# Patient Record
Sex: Male | Born: 1995 | Race: Black or African American | Hispanic: No | Marital: Single | Smoking: Current every day smoker
Health system: Southern US, Community
[De-identification: ages and names within clinical notes are randomized; demographics above are authoritative.]

---

## 2020-06-19 ENCOUNTER — Inpatient Hospital Stay (HOSPITAL_COMMUNITY): Payer: Self-pay

## 2020-06-19 ENCOUNTER — Encounter (HOSPITAL_COMMUNITY): Payer: Self-pay

## 2020-06-19 ENCOUNTER — Inpatient Hospital Stay (HOSPITAL_COMMUNITY): Payer: Self-pay | Admitting: Certified Registered"

## 2020-06-19 ENCOUNTER — Emergency Department (HOSPITAL_COMMUNITY): Payer: Self-pay

## 2020-06-19 ENCOUNTER — Other Ambulatory Visit: Payer: Self-pay

## 2020-06-19 ENCOUNTER — Inpatient Hospital Stay (HOSPITAL_COMMUNITY)
Admission: EM | Admit: 2020-06-19 | Discharge: 2020-06-20 | DRG: 482 | Disposition: A | Discharge status: Home / Self Care | Payer: Self-pay | Attending: Orthopedic Surgery | Admitting: Orthopedic Surgery

## 2020-06-19 ENCOUNTER — Encounter (HOSPITAL_COMMUNITY)
Admission: EM | Disposition: A | Discharge status: Home / Self Care | Payer: Self-pay | Source: Home / Self Care | Attending: Orthopedic Surgery

## 2020-06-19 DIAGNOSIS — Z20822 Contact with and (suspected) exposure to covid-19: Secondary | ICD-10-CM | POA: Diagnosis present

## 2020-06-19 DIAGNOSIS — S72302A Unspecified fracture of shaft of left femur, initial encounter for closed fracture: Secondary | ICD-10-CM

## 2020-06-19 DIAGNOSIS — Z419 Encounter for procedure for purposes other than remedying health state, unspecified: Secondary | ICD-10-CM

## 2020-06-19 DIAGNOSIS — G8911 Acute pain due to trauma: Secondary | ICD-10-CM | POA: Diagnosis present

## 2020-06-19 DIAGNOSIS — S50311A Abrasion of right elbow, initial encounter: Secondary | ICD-10-CM | POA: Diagnosis present

## 2020-06-19 DIAGNOSIS — S72352B Displaced comminuted fracture of shaft of left femur, initial encounter for open fracture type I or II: Principal | ICD-10-CM | POA: Diagnosis present

## 2020-06-19 DIAGNOSIS — W3400XA Accidental discharge from unspecified firearms or gun, initial encounter: Secondary | ICD-10-CM

## 2020-06-19 DIAGNOSIS — Z23 Encounter for immunization: Secondary | ICD-10-CM

## 2020-06-19 DIAGNOSIS — S7292XA Unspecified fracture of left femur, initial encounter for closed fracture: Secondary | ICD-10-CM | POA: Diagnosis present

## 2020-06-19 HISTORY — PX: FEMUR IM NAIL: SHX1597

## 2020-06-19 LAB — COMPREHENSIVE METABOLIC PANEL
ALT: 23 U/L (ref 0–44)
AST: 30 U/L (ref 15–41)
Albumin: 3.9 g/dL (ref 3.5–5.0)
Alkaline Phosphatase: 43 U/L (ref 38–126)
Anion gap: 11 (ref 5–15)
BUN: 10 mg/dL (ref 6–20)
CO2: 24 mmol/L (ref 22–32)
Calcium: 9.1 mg/dL (ref 8.9–10.3)
Chloride: 110 mmol/L (ref 98–111)
Creatinine, Ser: 1.16 mg/dL (ref 0.61–1.24)
GFR, Estimated: >60 mL/min (ref >60)
Glucose, Bld: 111 mg/dL — ABNORMAL HIGH (ref 70–99)
Potassium: 3.4 mmol/L — ABNORMAL LOW (ref 3.5–5.1)
Sodium: 145 mmol/L (ref 135–145)
Total Bilirubin: 0.7 mg/dL (ref 0.3–1.2)
Total Protein: 6.7 g/dL (ref 6.5–8.1)

## 2020-06-19 LAB — ETHANOL: Alcohol, Ethyl (B): 221 mg/dL — ABNORMAL HIGH (ref <10)

## 2020-06-19 LAB — RESP PANEL BY RT-PCR (FLU A&B, COVID) ARPGX2
Influenza A by PCR: NEGATIVE
Influenza B by PCR: NEGATIVE
SARS Coronavirus 2 by RT PCR: NEGATIVE

## 2020-06-19 LAB — CBC
HCT: 40.3 % (ref 39.0–52.0)
Hemoglobin: 13.4 g/dL (ref 13.0–17.0)
MCH: 32.1 pg (ref 26.0–34.0)
MCHC: 33.3 g/dL (ref 30.0–36.0)
MCV: 96.4 fL (ref 80.0–100.0)
Platelets: 283 10*3/uL (ref 150–400)
RBC: 4.18 MIL/uL — ABNORMAL LOW (ref 4.22–5.81)
RDW: 13.4 % (ref 11.5–15.5)
WBC: 8.8 10*3/uL (ref 4.0–10.5)
nRBC: 0 % (ref 0.0–0.2)

## 2020-06-19 LAB — SAMPLE TO BLOOD BANK

## 2020-06-19 LAB — SURGICAL PCR SCREEN
MRSA, PCR: NEGATIVE
Staphylococcus aureus: NEGATIVE

## 2020-06-19 LAB — I-STAT CHEM 8, ED
BUN: 11 mg/dL (ref 6–20)
Calcium, Ion: 1.08 mmol/L — ABNORMAL LOW (ref 1.15–1.40)
Chloride: 110 mmol/L (ref 98–111)
Creatinine, Ser: 1.3 mg/dL — ABNORMAL HIGH (ref 0.61–1.24)
Glucose, Bld: 109 mg/dL — ABNORMAL HIGH (ref 70–99)
HCT: 40 % (ref 39.0–52.0)
Hemoglobin: 13.6 g/dL (ref 13.0–17.0)
Potassium: 3.3 mmol/L — ABNORMAL LOW (ref 3.5–5.1)
Sodium: 146 mmol/L — ABNORMAL HIGH (ref 135–145)
TCO2: 23 mmol/L (ref 22–32)

## 2020-06-19 LAB — TYPE AND SCREEN
ABO/RH(D): A POS
Antibody Screen: NEGATIVE

## 2020-06-19 LAB — ABO/RH: ABO/RH(D): A POS

## 2020-06-19 LAB — HIV ANTIBODY (ROUTINE TESTING W REFLEX): HIV Screen 4th Generation wRfx: NONREACTIVE

## 2020-06-19 SURGERY — INSERTION, INTRAMEDULLARY ROD, FEMUR
Anesthesia: General | Laterality: Left

## 2020-06-19 MED ORDER — ONDANSETRON HCL 4 MG/2ML IJ SOLN: 4.0000 mg | Freq: Four times a day (QID) | INTRAMUSCULAR | Status: DC | PRN

## 2020-06-19 MED ORDER — FENTANYL CITRATE (PF) 100 MCG/2ML IJ SOLN: 25.0000 ug | INTRAMUSCULAR | Status: DC | PRN

## 2020-06-19 MED ORDER — ROCURONIUM BROMIDE 10 MG/ML (PF) SYRINGE
PREFILLED_SYRINGE | INTRAVENOUS | Status: DC | PRN
  Administered 2020-06-19: 20 mg via INTRAVENOUS
  Administered 2020-06-19: 30 mg via INTRAVENOUS

## 2020-06-19 MED ORDER — ENOXAPARIN SODIUM 30 MG/0.3ML IJ SOSY: 30.0000 mg | PREFILLED_SYRINGE | Freq: Two times a day (BID) | INTRAMUSCULAR | Status: DC

## 2020-06-19 MED ORDER — ONDANSETRON HCL 4 MG/2ML IJ SOLN: 4.0000 mg | Freq: Once | INTRAMUSCULAR | Status: DC | PRN

## 2020-06-19 MED ORDER — CEFAZOLIN SODIUM-DEXTROSE 2-4 GM/100ML-% IV SOLN: 2.0000 g | INTRAVENOUS | Status: DC

## 2020-06-19 MED ORDER — ACETAMINOPHEN 500 MG PO TABS
1000.0000 mg | ORAL_TABLET | Freq: Once | ORAL | Status: AC
  Administered 2020-06-19: 1000 mg via ORAL

## 2020-06-19 MED ORDER — OXYCODONE HCL 5 MG PO TABS: 10.0000 mg | ORAL_TABLET | ORAL | Status: DC | PRN

## 2020-06-19 MED ORDER — OXYCODONE HCL 5 MG PO TABS: 5.0000 mg | ORAL_TABLET | ORAL | Status: DC | PRN

## 2020-06-19 MED ORDER — DEXAMETHASONE SODIUM PHOSPHATE 10 MG/ML IJ SOLN
8.0000 mg | Freq: Once | INTRAMUSCULAR | Status: AC
  Administered 2020-06-19: 4 mg via INTRAVENOUS

## 2020-06-19 MED ORDER — MORPHINE SULFATE (PF) 4 MG/ML IV SOLN
INTRAVENOUS | Status: AC
  Filled 2020-06-19: qty 1

## 2020-06-19 MED ORDER — METOCLOPRAMIDE HCL 5 MG/ML IJ SOLN: 5.0000 mg | Freq: Three times a day (TID) | INTRAMUSCULAR | Status: DC | PRN

## 2020-06-19 MED ORDER — ACETAMINOPHEN 325 MG PO TABS: 325.0000 mg | ORAL_TABLET | Freq: Four times a day (QID) | ORAL | Status: DC | PRN

## 2020-06-19 MED ORDER — LIDOCAINE HCL (CARDIAC) PF 100 MG/5ML IV SOSY
PREFILLED_SYRINGE | INTRAVENOUS | Status: DC | PRN
  Administered 2020-06-19: 40 mg via INTRAVENOUS

## 2020-06-19 MED ORDER — MENTHOL 3 MG MT LOZG: 1.0000 | LOZENGE | OROMUCOSAL | Status: DC | PRN

## 2020-06-19 MED ORDER — SODIUM CHLORIDE 0.9 % IR SOLN
Status: DC | PRN
  Administered 2020-06-19: 1000 mL

## 2020-06-19 MED ORDER — ALUM & MAG HYDROXIDE-SIMETH 200-200-20 MG/5ML PO SUSP: 30.0000 mL | ORAL | Status: DC | PRN

## 2020-06-19 MED ORDER — TETANUS-DIPHTH-ACELL PERTUSSIS 5-2.5-18.5 LF-MCG/0.5 IM SUSY
0.5000 mL | PREFILLED_SYRINGE | Freq: Once | INTRAMUSCULAR | Status: AC
  Administered 2020-06-19: 0.5 mL via INTRAMUSCULAR
  Filled 2020-06-19: qty 0.5

## 2020-06-19 MED ORDER — MORPHINE SULFATE 2 MG/ML IJ SOLN
INTRAMUSCULAR | Status: AC | PRN
  Administered 2020-06-19: 4 mg via INTRAVENOUS

## 2020-06-19 MED ORDER — PHENYLEPHRINE 40 MCG/ML (10ML) SYRINGE FOR IV PUSH (FOR BLOOD PRESSURE SUPPORT)
PREFILLED_SYRINGE | INTRAVENOUS | Status: AC
  Filled 2020-06-19: qty 10

## 2020-06-19 MED ORDER — TRAMADOL HCL 50 MG PO TABS
50.0000 mg | ORAL_TABLET | Freq: Four times a day (QID) | ORAL | Status: DC
Start: 2020-06-19 — End: 2020-06-20
  Administered 2020-06-19 – 2020-06-20 (×4): 50 mg via ORAL
  Filled 2020-06-19 (×4): qty 1

## 2020-06-19 MED ORDER — ENOXAPARIN SODIUM 40 MG/0.4ML IJ SOSY
40.0000 mg | PREFILLED_SYRINGE | INTRAMUSCULAR | Status: DC
  Administered 2020-06-20: 40 mg via SUBCUTANEOUS
  Filled 2020-06-19: qty 0.4

## 2020-06-19 MED ORDER — CHLORHEXIDINE GLUCONATE 0.12 % MT SOLN
OROMUCOSAL | Status: AC
  Administered 2020-06-19: 15 mL via OROMUCOSAL
  Filled 2020-06-19: qty 15

## 2020-06-19 MED ORDER — TRANEXAMIC ACID-NACL 1000-0.7 MG/100ML-% IV SOLN
1000.0000 mg | Freq: Once | INTRAVENOUS | Status: AC
  Administered 2020-06-19: 1000 mg via INTRAVENOUS
  Filled 2020-06-19: qty 100

## 2020-06-19 MED ORDER — SUFENTANIL CITRATE 50 MCG/ML IV SOLN
INTRAVENOUS | Status: DC | PRN
  Administered 2020-06-19: 10 ug via INTRAVENOUS
  Administered 2020-06-19: 5 ug via INTRAVENOUS

## 2020-06-19 MED ORDER — TRANEXAMIC ACID-NACL 1000-0.7 MG/100ML-% IV SOLN
1000.0000 mg | INTRAVENOUS | Status: AC
  Administered 2020-06-19: 1000 mg via INTRAVENOUS

## 2020-06-19 MED ORDER — SODIUM CHLORIDE 0.9 % IV SOLN: INTRAVENOUS | Status: DC

## 2020-06-19 MED ORDER — POLYETHYLENE GLYCOL 3350 17 G PO PACK: 17.0000 g | PACK | Freq: Every day | ORAL | Status: DC | PRN

## 2020-06-19 MED ORDER — FENTANYL CITRATE (PF) 100 MCG/2ML IJ SOLN
100.0000 ug | Freq: Once | INTRAMUSCULAR | Status: AC
  Administered 2020-06-19: 100 ug via INTRAVENOUS
  Filled 2020-06-19: qty 2

## 2020-06-19 MED ORDER — METHOCARBAMOL 500 MG PO TABS: 500.0000 mg | ORAL_TABLET | Freq: Four times a day (QID) | ORAL | Status: DC | PRN

## 2020-06-19 MED ORDER — DOCUSATE SODIUM 100 MG PO CAPS
100.0000 mg | ORAL_CAPSULE | Freq: Two times a day (BID) | ORAL | Status: DC
  Administered 2020-06-19 – 2020-06-20 (×2): 100 mg via ORAL
  Filled 2020-06-19 (×2): qty 1

## 2020-06-19 MED ORDER — CHLORHEXIDINE GLUCONATE 0.12 % MT SOLN: 15.0000 mL | Freq: Once | OROMUCOSAL | Status: AC

## 2020-06-19 MED ORDER — PHENOL 1.4 % MT LIQD: 1.0000 | OROMUCOSAL | Status: DC | PRN

## 2020-06-19 MED ORDER — PROPOFOL 10 MG/ML IV BOLUS
INTRAVENOUS | Status: DC | PRN
  Administered 2020-06-19: 170 mg via INTRAVENOUS

## 2020-06-19 MED ORDER — CEFAZOLIN SODIUM-DEXTROSE 2-4 GM/100ML-% IV SOLN
2.0000 g | Freq: Three times a day (TID) | INTRAVENOUS | Status: AC
  Administered 2020-06-19 – 2020-06-20 (×3): 2 g via INTRAVENOUS
  Filled 2020-06-19 (×3): qty 100

## 2020-06-19 MED ORDER — ONDANSETRON 4 MG PO TBDP: 4.0000 mg | ORAL_TABLET | Freq: Four times a day (QID) | ORAL | Status: DC | PRN

## 2020-06-19 MED ORDER — MIDAZOLAM HCL 2 MG/2ML IJ SOLN
INTRAMUSCULAR | Status: AC
  Filled 2020-06-19: qty 2

## 2020-06-19 MED ORDER — CEFAZOLIN SODIUM-DEXTROSE 2-4 GM/100ML-% IV SOLN
2.0000 g | Freq: Once | INTRAVENOUS | Status: AC
  Administered 2020-06-19: 2 g via INTRAVENOUS
  Filled 2020-06-19: qty 100

## 2020-06-19 MED ORDER — PROPOFOL 10 MG/ML IV BOLUS
INTRAVENOUS | Status: AC
  Filled 2020-06-19: qty 20

## 2020-06-19 MED ORDER — BISACODYL 10 MG RE SUPP: 10.0000 mg | Freq: Every day | RECTAL | Status: DC | PRN

## 2020-06-19 MED ORDER — POVIDONE-IODINE 10 % EX SWAB: 2.0000 "application " | Freq: Once | CUTANEOUS | Status: DC

## 2020-06-19 MED ORDER — MIDAZOLAM HCL 2 MG/2ML IJ SOLN
INTRAMUSCULAR | Status: DC | PRN
  Administered 2020-06-19: 2 mg via INTRAVENOUS

## 2020-06-19 MED ORDER — TRANEXAMIC ACID-NACL 1000-0.7 MG/100ML-% IV SOLN
INTRAVENOUS | Status: AC
  Filled 2020-06-19: qty 100

## 2020-06-19 MED ORDER — DOCUSATE SODIUM 100 MG PO CAPS: 100.0000 mg | ORAL_CAPSULE | Freq: Two times a day (BID) | ORAL | Status: DC

## 2020-06-19 MED ORDER — HYDROMORPHONE HCL 1 MG/ML IJ SOLN
0.5000 mg | INTRAMUSCULAR | Status: DC | PRN
  Administered 2020-06-19: 0.5 mg via INTRAVENOUS
  Administered 2020-06-19 – 2020-06-20 (×2): 1 mg via INTRAVENOUS
  Filled 2020-06-19 (×3): qty 1

## 2020-06-19 MED ORDER — LACTATED RINGERS IV SOLN: INTRAVENOUS | Status: DC

## 2020-06-19 MED ORDER — LACTATED RINGERS IV SOLN: INTRAVENOUS | Status: DC | PRN

## 2020-06-19 MED ORDER — SUGAMMADEX SODIUM 200 MG/2ML IV SOLN
INTRAVENOUS | Status: DC | PRN
  Administered 2020-06-19: 200 mg via INTRAVENOUS

## 2020-06-19 MED ORDER — PANTOPRAZOLE SODIUM 40 MG PO TBEC
40.0000 mg | DELAYED_RELEASE_TABLET | Freq: Every day | ORAL | Status: DC
  Administered 2020-06-19 – 2020-06-20 (×2): 40 mg via ORAL
  Filled 2020-06-19 (×2): qty 1

## 2020-06-19 MED ORDER — ACETAMINOPHEN 500 MG PO TABS: 1000.0000 mg | ORAL_TABLET | Freq: Four times a day (QID) | ORAL | Status: DC

## 2020-06-19 MED ORDER — IOHEXOL 350 MG/ML SOLN
100.0000 mL | Freq: Once | INTRAVENOUS | Status: AC | PRN
  Administered 2020-06-19: 100 mL via INTRAVENOUS

## 2020-06-19 MED ORDER — CHLORHEXIDINE GLUCONATE 4 % EX LIQD: 60.0000 mL | Freq: Once | CUTANEOUS | Status: DC

## 2020-06-19 MED ORDER — ACETAMINOPHEN 500 MG PO TABS
1000.0000 mg | ORAL_TABLET | Freq: Three times a day (TID) | ORAL | Status: DC
  Administered 2020-06-19: 1000 mg via ORAL
  Filled 2020-06-19 (×2): qty 2

## 2020-06-19 MED ORDER — PHENYLEPHRINE 40 MCG/ML (10ML) SYRINGE FOR IV PUSH (FOR BLOOD PRESSURE SUPPORT)
PREFILLED_SYRINGE | INTRAVENOUS | Status: DC | PRN
  Administered 2020-06-19: 40 ug via INTRAVENOUS
  Administered 2020-06-19 (×2): 80 ug via INTRAVENOUS
  Administered 2020-06-19: 40 ug via INTRAVENOUS
  Administered 2020-06-19: 80 ug via INTRAVENOUS
  Administered 2020-06-19 (×2): 40 ug via INTRAVENOUS

## 2020-06-19 MED ORDER — METHOCARBAMOL 1000 MG/10ML IJ SOLN
500.0000 mg | Freq: Four times a day (QID) | INTRAVENOUS | Status: DC | PRN
  Filled 2020-06-19: qty 5

## 2020-06-19 MED ORDER — ONDANSETRON HCL 4 MG/2ML IJ SOLN
INTRAMUSCULAR | Status: DC | PRN
  Administered 2020-06-19: 4 mg via INTRAVENOUS

## 2020-06-19 MED ORDER — ALBUMIN HUMAN 5 % IV SOLN: INTRAVENOUS | Status: DC | PRN

## 2020-06-19 MED ORDER — CEFAZOLIN SODIUM-DEXTROSE 2-4 GM/100ML-% IV SOLN
2.0000 g | Freq: Three times a day (TID) | INTRAVENOUS | Status: DC
  Administered 2020-06-19: 2 g via INTRAVENOUS
  Filled 2020-06-19 (×3): qty 100

## 2020-06-19 MED ORDER — METOCLOPRAMIDE HCL 5 MG PO TABS: 5.0000 mg | ORAL_TABLET | Freq: Three times a day (TID) | ORAL | Status: DC | PRN

## 2020-06-19 MED ORDER — SUFENTANIL CITRATE 50 MCG/ML IV SOLN
INTRAVENOUS | Status: AC
  Filled 2020-06-19: qty 1

## 2020-06-19 MED ORDER — DEXAMETHASONE SODIUM PHOSPHATE 10 MG/ML IJ SOLN
INTRAMUSCULAR | Status: AC
  Filled 2020-06-19: qty 1

## 2020-06-19 MED ORDER — ONDANSETRON HCL 4 MG PO TABS: 4.0000 mg | ORAL_TABLET | Freq: Four times a day (QID) | ORAL | Status: DC | PRN

## 2020-06-19 MED ORDER — SUCCINYLCHOLINE 20MG/ML (10ML) SYRINGE FOR MEDFUSION PUMP - OPTIME
INTRAMUSCULAR | Status: DC | PRN
  Administered 2020-06-19: 140 mg via INTRAVENOUS

## 2020-06-19 MED ORDER — ORAL CARE MOUTH RINSE: 15.0000 mL | Freq: Once | OROMUCOSAL | Status: AC

## 2020-06-19 MED ORDER — MAGNESIUM CITRATE PO SOLN: 1.0000 | Freq: Once | ORAL | Status: DC | PRN

## 2020-06-19 MED ORDER — SODIUM CHLORIDE 0.9 % IV SOLN
INTRAVENOUS | Status: AC | PRN
  Administered 2020-06-19: 1000 mL via INTRAVENOUS

## 2020-06-19 MED ORDER — ONDANSETRON HCL 4 MG/2ML IJ SOLN
INTRAMUSCULAR | Status: AC
  Filled 2020-06-19: qty 2

## 2020-06-19 MED ORDER — HYDROMORPHONE HCL 1 MG/ML IJ SOLN
0.5000 mg | INTRAMUSCULAR | Status: DC | PRN
  Administered 2020-06-19 (×2): 0.5 mg via INTRAVENOUS
  Filled 2020-06-19: qty 1
  Filled 2020-06-19: qty 0.5

## 2020-06-19 MED ORDER — ROCURONIUM BROMIDE 10 MG/ML (PF) SYRINGE
PREFILLED_SYRINGE | INTRAVENOUS | Status: AC
  Filled 2020-06-19: qty 10

## 2020-06-19 SURGICAL SUPPLY — 55 items
BIT DRILL AO GAMMA 4.2X180 (BIT) ×1 IMPLANT
BIT DRILL AO GAMMA 4.2X340 (BIT) ×1 IMPLANT
BNDG CMPR MED 15X6 ELC VLCR LF (GAUZE/BANDAGES/DRESSINGS) ×1
BNDG ELASTIC 6X15 VLCR STRL LF (GAUZE/BANDAGES/DRESSINGS) ×1 IMPLANT
BNDG ELASTIC 6X5.8 VLCR STR LF (GAUZE/BANDAGES/DRESSINGS) ×1 IMPLANT
CLSR STERI-STRIP ANTIMIC 1/2X4 (GAUZE/BANDAGES/DRESSINGS) ×1 IMPLANT
COVER MAYO STAND STRL (DRAPES) ×2 IMPLANT
COVER PERINEAL POST (MISCELLANEOUS) ×1 IMPLANT
COVER SURGICAL LIGHT HANDLE (MISCELLANEOUS) ×2 IMPLANT
COVER WAND RF STERILE (DRAPES) ×2 IMPLANT
DRAPE STERI IOBAN 125X83 (DRAPES) ×2 IMPLANT
DRSG ADAPTIC 3X8 NADH LF (GAUZE/BANDAGES/DRESSINGS) ×1 IMPLANT
DRSG MEPILEX BORDER 4X4 (GAUZE/BANDAGES/DRESSINGS) ×6 IMPLANT
DURAPREP 26ML APPLICATOR (WOUND CARE) ×3 IMPLANT
ELECT REM PT RETURN 9FT ADLT (ELECTROSURGICAL) ×2
ELECTRODE REM PT RTRN 9FT ADLT (ELECTROSURGICAL) ×1 IMPLANT
GAUZE SPONGE 4X4 12PLY STRL (GAUZE/BANDAGES/DRESSINGS) ×2 IMPLANT
GLOVE BIO SURGEON STRL SZ7.5 (GLOVE) ×2 IMPLANT
GLOVE BIOGEL PI IND STRL 7.5 (GLOVE) ×1 IMPLANT
GLOVE BIOGEL PI INDICATOR 7.5 (GLOVE) ×1
GLOVE SRG 8 PF TXTR STRL LF DI (GLOVE) ×1 IMPLANT
GLOVE SURG SYN 7.5  E (GLOVE) ×1
GLOVE SURG SYN 7.5 E (GLOVE) ×1 IMPLANT
GLOVE SURG SYN 7.5 PF PI (GLOVE) ×1 IMPLANT
GLOVE SURG UNDER POLY LF SZ8 (GLOVE) ×2
GOWN STRL REUS W/ TWL LRG LVL3 (GOWN DISPOSABLE) ×2 IMPLANT
GOWN STRL REUS W/ TWL XL LVL3 (GOWN DISPOSABLE) ×2 IMPLANT
GOWN STRL REUS W/TWL LRG LVL3 (GOWN DISPOSABLE) ×4
GOWN STRL REUS W/TWL XL LVL3 (GOWN DISPOSABLE) ×4
GUIDEROD T2 3X1000 (ROD) ×1 IMPLANT
GUIDEWIRE GAMMA (WIRE) ×1 IMPLANT
KIT BASIN OR (CUSTOM PROCEDURE TRAY) ×1 IMPLANT
KIT TURNOVER KIT B (KITS) ×2 IMPLANT
MANIFOLD NEPTUNE II (INSTRUMENTS) ×1 IMPLANT
NAIL SUPRACONDYLAR 11X380MM (Nail) ×1 IMPLANT
NS IRRIG 1000ML POUR BTL (IV SOLUTION) ×1 IMPLANT
PACK GENERAL/GYN (CUSTOM PROCEDURE TRAY) ×2 IMPLANT
PAD ABD 8X10 STRL (GAUZE/BANDAGES/DRESSINGS) ×2 IMPLANT
PAD ARMBOARD 7.5X6 YLW CONV (MISCELLANEOUS) ×4 IMPLANT
PADDING CAST COTTON 6X4 STRL (CAST SUPPLIES) ×2 IMPLANT
REAMER SHAFT BIXCUT (INSTRUMENTS) ×1 IMPLANT
SCREW LOCK 5X90 FT T2 (Screw) ×1 IMPLANT
SCREW LOCKING T2 F/T  5MMX70MM (Screw) ×1 IMPLANT
SCREW LOCKING T2 F/T 5MMX70MM (Screw) IMPLANT
SCREW LOCKING THREADED 5X47.5 (Screw) ×1 IMPLANT
STRIP CLOSURE SKIN 1/2X4 (GAUZE/BANDAGES/DRESSINGS) ×1 IMPLANT
SUT ETHILON 3 0 PS 1 (SUTURE) ×2 IMPLANT
SUT MNCRL AB 4-0 PS2 18 (SUTURE) ×2 IMPLANT
SUT VIC AB 0 CT1 27 (SUTURE) ×2
SUT VIC AB 0 CT1 27XBRD ANBCTR (SUTURE) IMPLANT
SUT VIC AB 2-0 CT1 27 (SUTURE) ×2
SUT VIC AB 2-0 CT1 TAPERPNT 27 (SUTURE) ×1 IMPLANT
TOWEL GREEN STERILE (TOWEL DISPOSABLE) ×2 IMPLANT
TOWEL OR NON WOVEN STRL DISP B (DISPOSABLE) ×2 IMPLANT
WATER STERILE IRR 1000ML POUR (IV SOLUTION) ×2 IMPLANT

## 2020-06-19 NOTE — Interval H&P Note (Signed)
History and Physical Interval Note:  06/19/2020 12:03 PM  Allen Wall  has presented today for surgery, with the diagnosis of Left femur fracture.  The various methods of treatment have been discussed with the patient and family. After consideration of risks, benefits and other options for treatment, the patient has consented to  Procedure(s): INTRAMEDULLARY (IM) NAIL FEMORAL (Left) as a surgical intervention.  The patient's history has been reviewed, patient examined, no change in status, stable for surgery.  I have reviewed the patient's chart and labs.  Questions were answered to the patient's satisfaction.     Sheral Apley
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
## 2020-06-19 NOTE — Progress Notes (Signed)
Orthopedic Tech Progress Note Patient Details:  Daking Westervelt May 27, 1995 [id number]  Patient ID: Theus Espin, male   DOB: [birth year], 25 y.o.   MRN: [id number]   Kizzie Fantasia 06/19/2020, 2:53 AM trauma

## 2020-06-19 NOTE — Transfer of Care (Signed)
Immediate Anesthesia Transfer of Care Note  Patient: Philippe Gang  Procedure(s) Performed: INTRAMEDULLARY (IM) NAIL FEMORAL (Left )  Patient Location: PACU  Anesthesia Type:General  Level of Consciousness: awake, alert , oriented and patient cooperative  Airway & Oxygen Therapy: Patient Spontanous Breathing  Post-op Assessment: Report given to RN and Post -op Vital signs reviewed and stable  Post vital signs: Reviewed and stable  Last Vitals:  Vitals Value Taken Time  BP 137/95 06/19/20 1614  Temp    Pulse 56 06/19/20 1615  Resp 13 06/19/20 1615  SpO2 100 % 06/19/20 1615  Vitals shown include unvalidated device data.  Last Pain:  Vitals:   06/19/20 1200  TempSrc:   PainSc: Asleep         Complications: No complications documented.
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
## 2020-06-19 NOTE — Anesthesia Procedure Notes (Signed)
Procedure Name: Intubation Date/Time: 06/19/2020 2:32 PM Performed by: Claris Che, CRNA Pre-anesthesia Checklist: Patient identified, Emergency Drugs available, Suction available, Patient being monitored and Timeout performed Patient Re-evaluated:Patient Re-evaluated prior to induction Oxygen Delivery Method: Circle system utilized Preoxygenation: Pre-oxygenation with 100% oxygen Induction Type: IV induction, Rapid sequence and Cricoid Pressure applied Ventilation: Mask ventilation without difficulty Laryngoscope Size: Mac and 4 Grade View: Grade II Tube type: Oral Tube size: 7.5 mm Number of attempts: 1 Airway Equipment and Method: Stylet Placement Confirmation: ETT inserted through vocal cords under direct vision,  positive ETCO2 and breath sounds checked- equal and bilateral Secured at: 23 cm Tube secured with: Tape Dental Injury: Teeth and Oropharynx as per pre-operative assessment
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
## 2020-06-19 NOTE — ED Notes (Signed)
Patient transported to CT 

## 2020-06-19 NOTE — Anesthesia Postprocedure Evaluation (Signed)
Anesthesia Post Note  Patient: Allen Wall  Procedure(s) Performed: INTRAMEDULLARY (IM) NAIL FEMORAL (Left )     Patient location during evaluation: PACU Anesthesia Type: General Level of consciousness: awake and alert Pain management: pain level controlled Vital Signs Assessment: post-procedure vital signs reviewed and stable Respiratory status: spontaneous breathing, nonlabored ventilation, respiratory function stable and patient connected to nasal cannula oxygen Cardiovascular status: blood pressure returned to baseline and stable Postop Assessment: no apparent nausea or vomiting Anesthetic complications: no   No complications documented.  Last Vitals:  Vitals:   06/19/20 1644 06/19/20 1657  BP: 125/79 124/77  Pulse: (!) 49 60  Resp: 14 17  Temp: 36.7 C 36.8 C  SpO2: 100% 98%    Last Pain:  Vitals:   06/19/20 1657  TempSrc: Oral  PainSc: 7                  Alberta Lenhard S

## 2020-06-19 NOTE — Anesthesia Preprocedure Evaluation (Addendum)
Anesthesia Evaluation  Patient identified by MRN, date of birth, ID band Patient awake    Reviewed: Allergy & Precautions, NPO status , Patient's Chart, lab work & pertinent test results  Airway Mallampati: II  TM Distance: >3 FB Neck ROM: Full    Dental  (+) Teeth Intact, Dental Advisory Given   Pulmonary    breath sounds clear to auscultation       Cardiovascular  Rhythm:Regular Rate:Normal     Neuro/Psych    GI/Hepatic   Endo/Other    Renal/GU      Musculoskeletal   Abdominal   Peds  Hematology   Anesthesia Other Findings   Reproductive/Obstetrics                            Anesthesia Physical Anesthesia Plan  ASA: II  Anesthesia Plan: General   Post-op Pain Management:    Induction: Intravenous, Cricoid pressure planned and Rapid sequence  PONV Risk Score and Plan: Ondansetron and Dexamethasone  Airway Management Planned: Oral ETT  Additional Equipment:   Intra-op Plan:   Post-operative Plan: Extubation in OR  Informed Consent: I have reviewed the patients History and Physical, chart, labs and discussed the procedure including the risks, benefits and alternatives for the proposed anesthesia with the patient or authorized representative who has indicated his/her understanding and acceptance.     Dental advisory given  Plan Discussed with: CRNA and Anesthesiologist  Anesthesia Plan Comments:         Anesthesia Quick Evaluation

## 2020-06-19 NOTE — Plan of Care (Signed)
  Problem: Clinical Measurements: Goal: Ability to maintain clinical measurements within normal limits will improve Outcome: Progressing   Problem: Coping: Goal: Level of anxiety will decrease Outcome: Progressing   Problem: Elimination: Goal: Will not experience complications related to bowel motility Outcome: Progressing   Problem: Pain Managment: Goal: General experience of comfort will improve Outcome: Progressing   Problem: Safety: Goal: Ability to remain free from injury will improve Outcome: Progressing   Problem: Skin Integrity: Goal: Risk for impaired skin integrity will decrease Outcome: Progressing   
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
## 2020-06-20 ENCOUNTER — Encounter (HOSPITAL_COMMUNITY): Payer: Self-pay | Admitting: Orthopedic Surgery

## 2020-06-20 LAB — CBC
HCT: 34.9 % — ABNORMAL LOW (ref 39.0–52.0)
Hemoglobin: 11.5 g/dL — ABNORMAL LOW (ref 13.0–17.0)
MCH: 32 pg (ref 26.0–34.0)
MCHC: 33 g/dL (ref 30.0–36.0)
MCV: 97.2 fL (ref 80.0–100.0)
Platelets: 210 10*3/uL (ref 150–400)
RBC: 3.59 MIL/uL — ABNORMAL LOW (ref 4.22–5.81)
RDW: 13.2 % (ref 11.5–15.5)
WBC: 7.5 10*3/uL (ref 4.0–10.5)
nRBC: 0 % (ref 0.0–0.2)

## 2020-06-20 LAB — BASIC METABOLIC PANEL
Anion gap: 7 (ref 5–15)
BUN: 7 mg/dL (ref 6–20)
CO2: 26 mmol/L (ref 22–32)
Calcium: 8.4 mg/dL — ABNORMAL LOW (ref 8.9–10.3)
Chloride: 101 mmol/L (ref 98–111)
Creatinine, Ser: 0.94 mg/dL (ref 0.61–1.24)
GFR, Estimated: >60 mL/min (ref >60)
Glucose, Bld: 123 mg/dL — ABNORMAL HIGH (ref 70–99)
Potassium: 4.4 mmol/L (ref 3.5–5.1)
Sodium: 134 mmol/L — ABNORMAL LOW (ref 135–145)

## 2020-06-20 MED ORDER — OXYCODONE HCL 5 MG PO TABS: 5.0000 mg | ORAL_TABLET | Freq: Four times a day (QID) | ORAL | 0 refills | Status: AC | PRN

## 2020-06-20 MED ORDER — ACETAMINOPHEN 500 MG PO TABS: 1000.0000 mg | ORAL_TABLET | Freq: Four times a day (QID) | ORAL | 0 refills | Status: AC | PRN

## 2020-06-20 MED ORDER — METHOCARBAMOL 500 MG PO TABS: 500.0000 mg | ORAL_TABLET | Freq: Three times a day (TID) | ORAL | 0 refills | Status: AC | PRN

## 2020-06-20 MED ORDER — ASPIRIN EC 81 MG PO TBEC: 81.0000 mg | DELAYED_RELEASE_TABLET | Freq: Two times a day (BID) | ORAL | 0 refills | Status: AC

## 2020-06-20 NOTE — Plan of Care (Signed)
  Problem: Education: Goal: Knowledge of General Education information will improve Description: Including pain rating scale, medication(s)/side effects and non-pharmacologic comfort measures Outcome: Adequate for Discharge   
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
## 2020-06-20 NOTE — TOC Transition Note (Signed)
Transition of Care [facility]) - CM/SW Discharge Note   Patient Details  Name: Allen Wall MRN: [id number] Date of Birth: 1995-11-13  [facility] of [facility]) CM/SW Contact:  Durenda Guthrie, RN Phone Number: 06/20/2020, 5:00 PM   Clinical Narrative:   Case manager arranged for transportation home with [facility] [employer]. Information confirmed, transportation arranged for 5:30pm.,Patient to sign rider waiver and release of liability. CM notified patient's bedside RN, Darl Pikes. No further needs identified.           Patient Goals and CMS Choice        Discharge Placement                       Discharge Plan and Services                                     Social Determinants of Health (SDOH) Interventions     Readmission Risk Interventions No flowsheet data found.

## 2020-06-20 NOTE — Progress Notes (Signed)
Discharge instructions (including medications) discussed with and copy provided to patient/caregiver 

## 2020-06-20 NOTE — Progress Notes (Signed)
Orthopedic Tech Progress Note Patient Details:  Allen Wall [birth year] [id number]  Ortho Devices Type of Ortho Device: Crutches Ortho Device/Splint Interventions: Ordered,Application   Post Interventions Patient Tolerated: Well Instructions Provided: Adjustment of device   Maurene Capes 06/20/2020, 2:29 PM

## 2020-06-20 NOTE — TOC CAGE-AID Note (Signed)
Transition of Care [facility]) - CAGE-AID Screening   Patient Details  Name: Allen Wall MRN: [id number] Date of Birth: 21-May-1995  [facility]) CM/SW Contact:    Erin Sons, LCSW Phone Number: 06/20/2020, 11:59 AM   Clinical Narrative:  CSW completed CAGE-AID screening with pt with score of 0. Pt denies any drug use. States he does not have a drinking problem. Pt does not want any substance use education or resources.   CAGE-AID Screening:    Have You Ever Felt You Ought to Cut Down on Your Drinking or Drug Use?: No Have People Annoyed You By Critizing Your Drinking Or Drug Use?: No Have You Felt Bad Or Guilty About Your Drinking Or Drug Use?: No Have You Ever Had a Drink or Used Drugs First Thing In The Morning to Steady Your Nerves or to Get Rid of a Hangover?: No CAGE-AID Score: 0  Substance Abuse Education Offered: Yes
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
## 2020-06-20 NOTE — Plan of Care (Signed)

## 2020-06-22 ENCOUNTER — Encounter (HOSPITAL_COMMUNITY): Payer: Self-pay | Admitting: Emergency Medicine

## 2020-06-22 ENCOUNTER — Other Ambulatory Visit: Payer: Self-pay

## 2020-06-22 ENCOUNTER — Emergency Department (HOSPITAL_COMMUNITY)
Admission: EM | Admit: 2020-06-22 | Discharge: 2020-06-23 | Disposition: A | Discharge status: Home / Self Care | Payer: Self-pay | Attending: Emergency Medicine | Admitting: Emergency Medicine

## 2020-06-22 DIAGNOSIS — Z5321 Procedure and treatment not carried out due to patient leaving prior to being seen by health care provider: Secondary | ICD-10-CM | POA: Insufficient documentation

## 2020-06-22 DIAGNOSIS — R2242 Localized swelling, mass and lump, left lower limb: Secondary | ICD-10-CM | POA: Insufficient documentation
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
## 2020-06-23 NOTE — ED Notes (Signed)
Pt leaving AMA, advised to return if symptoms worsen. 

## 2020-06-23 NOTE — ED Notes (Signed)
Blood sample not able to be obtained

## 2021-03-29 ENCOUNTER — Emergency Department (HOSPITAL_COMMUNITY)
Admission: EM | Admit: 2021-03-29 | Discharge: 2021-03-29 | Disposition: A | Discharge status: Home / Self Care | Payer: Self-pay | Attending: Emergency Medicine | Admitting: Emergency Medicine

## 2021-03-29 ENCOUNTER — Other Ambulatory Visit: Payer: Self-pay

## 2021-03-29 ENCOUNTER — Encounter (HOSPITAL_COMMUNITY): Payer: Self-pay | Admitting: Emergency Medicine

## 2021-03-29 DIAGNOSIS — Z7982 Long term (current) use of aspirin: Secondary | ICD-10-CM | POA: Insufficient documentation

## 2021-03-29 DIAGNOSIS — Z09 Encounter for follow-up examination after completed treatment for conditions other than malignant neoplasm: Secondary | ICD-10-CM | POA: Insufficient documentation

## 2021-03-29 NOTE — Discharge Instructions (Signed)
Your test results will be available in MyChart.   The results should be back by the end of the week if not earlier.
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
## 2021-03-29 NOTE — ED Triage Notes (Signed)
Pt here for a STI follow. Was tested two weeks ago for same with negative results but is worried that he may test positive this time. Denies any current discharge, rashes, urinary frequency, dysuria.

## 2021-03-30 LAB — GC/CHLAMYDIA PROBE AMP (~~LOC~~) NOT AT ARMC
Chlamydia: NEGATIVE
Comment: NEGATIVE
Comment: NORMAL
Neisseria Gonorrhea: NEGATIVE

## 2021-03-30 LAB — RPR: RPR Ser Ql: NONREACTIVE

## 2021-03-31 LAB — HIV ANTIBODY (ROUTINE TESTING W REFLEX): HIV Screen 4th Generation wRfx: NONREACTIVE
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
